# Patient Record
Sex: Female | Born: 2000 | Hispanic: No | Marital: Single
Health system: Southern US, Community
[De-identification: ages and names within clinical notes are randomized; demographics above are authoritative.]

---

## 2021-08-18 ENCOUNTER — Other Ambulatory Visit: Payer: Self-pay

## 2021-08-18 ENCOUNTER — Emergency Department (HOSPITAL_BASED_OUTPATIENT_CLINIC_OR_DEPARTMENT_OTHER)
Admission: EM | Admit: 2021-08-18 | Discharge: 2021-08-18 | Disposition: A | Discharge status: Home / Self Care | Payer: 59 | Attending: Emergency Medicine | Admitting: Emergency Medicine

## 2021-08-18 ENCOUNTER — Emergency Department (HOSPITAL_BASED_OUTPATIENT_CLINIC_OR_DEPARTMENT_OTHER): Payer: 59

## 2021-08-18 ENCOUNTER — Encounter (HOSPITAL_BASED_OUTPATIENT_CLINIC_OR_DEPARTMENT_OTHER): Payer: Self-pay | Admitting: *Deleted

## 2021-08-18 DIAGNOSIS — R1031 Right lower quadrant pain: Secondary | ICD-10-CM | POA: Diagnosis not present

## 2021-08-18 LAB — COMPREHENSIVE METABOLIC PANEL
ALT: 10 U/L (ref 0–44)
AST: 15 U/L (ref 15–41)
Albumin: 3.8 g/dL (ref 3.5–5.0)
Alkaline Phosphatase: 64 U/L (ref 38–126)
Anion gap: 11 (ref 5–15)
BUN: 14 mg/dL (ref 6–20)
CO2: 26 mmol/L (ref 22–32)
Calcium: 8.9 mg/dL (ref 8.9–10.3)
Chloride: 99 mmol/L (ref 98–111)
Creatinine, Ser: 0.66 mg/dL (ref 0.44–1.00)
GFR, Estimated: >60 mL/min (ref >60)
Glucose, Bld: 82 mg/dL (ref 70–99)
Potassium: 3.5 mmol/L (ref 3.5–5.1)
Sodium: 136 mmol/L (ref 135–145)
Total Bilirubin: 0.2 mg/dL — ABNORMAL LOW (ref 0.3–1.2)
Total Protein: 6.9 g/dL (ref 6.5–8.1)

## 2021-08-18 LAB — URINALYSIS, ROUTINE W REFLEX MICROSCOPIC
Glucose, UA: NEGATIVE mg/dL
Hgb urine dipstick: NEGATIVE
Ketones, ur: 40 mg/dL — AB
Leukocytes,Ua: NEGATIVE
Nitrite: NEGATIVE
Protein, ur: NEGATIVE mg/dL
Specific Gravity, Urine: >1.03 — ABNORMAL HIGH (ref 1.005–1.030)
pH: 6 (ref 5.0–8.0)

## 2021-08-18 LAB — CBC
HCT: 40.8 % (ref 36.0–46.0)
Hemoglobin: 13.4 g/dL (ref 12.0–15.0)
MCH: 28.2 pg (ref 26.0–34.0)
MCHC: 32.8 g/dL (ref 30.0–36.0)
MCV: 85.7 fL (ref 80.0–100.0)
Platelets: 330 10*3/uL (ref 150–400)
RBC: 4.76 MIL/uL (ref 3.87–5.11)
RDW: 13.2 % (ref 11.5–15.5)
WBC: 11.3 10*3/uL — ABNORMAL HIGH (ref 4.0–10.5)
nRBC: 0 % (ref 0.0–0.2)

## 2021-08-18 LAB — PREGNANCY, URINE: Preg Test, Ur: NEGATIVE

## 2021-08-18 LAB — LIPASE, BLOOD: Lipase: 30 U/L (ref 11–51)

## 2021-08-18 MED ORDER — IOHEXOL 350 MG/ML SOLN
85.0000 mL | Freq: Once | INTRAVENOUS | Status: AC | PRN
  Administered 2021-08-18: 85 mL via INTRAVENOUS

## 2021-08-18 NOTE — Discharge Instructions (Addendum)
Patricia Sparks was seen in the ER today for right lower belly pain.  Her physical exam, vital signs, blood work, and CT scan were reassuring.  It appears she likely had a small ovarian cyst that ruptured.  This can be a normal physiologic process and is nothing to be concerned about.  May use Tylenol or ibuprofen as needed for your pain.  Please follow-up with your pediatrician in the outpatient setting.  Return to the ER with any worsening abdominal pain, nausea or vomiting that does not stop, chest pain, difficulty breathing, or any other severe symptom.

## 2021-08-18 NOTE — ED Triage Notes (Signed)
Sent from UC-r/o appy.  Reports RLQ pain x 1 day.  Denies N/V.  Denies fever.

## 2021-08-18 NOTE — ED Provider Notes (Signed)
[facility] EMERGENCY DEPARTMENT Provider Note   CSN: [id number] Arrival date & time: 08/18/21  1634     History Chief Complaint  Patient presents with   Abdominal Pain    Patricia Sparks is a 20 y.o. female who presents with concern for right lower abdominal pain that started around 3:00 this afternoon.  Presents with her father at the bedside.  No associated fever, chills, nausea, vomiting, or diarrhea.  No dysuria, urinary urgency.  LMP 08/03/2021.  Patient denies sexual activity.  Denies any vaginal bleeding or discharge.  Patient was seen at urgent care this afternoon and directed to the emergency department for concern for possible appendicitis.  I personally reviewed this patient's medical records.  She is an otherwise healthy 20 year old female who does not carry any medical diagnoses and is not on any medications every day.  HPI     No past medical history on file.  There are no problems to display for this patient.      OB History   No obstetric history on file.     No family history on file.  Social History   Substance Use Topics   Alcohol use: Never   Drug use: Never    Home Medications Prior to Admission medications   Not on File    Allergies    Patient has no known allergies.  Review of Systems   Review of Systems  Constitutional: Negative.   HENT: Negative.    Respiratory: Negative.    Cardiovascular: Negative.   Gastrointestinal:  Positive for abdominal pain. Negative for constipation, diarrhea, nausea and vomiting.  Genitourinary: Negative.   Musculoskeletal: Negative.   Skin: Negative.   Neurological: Negative.    Physical Exam Updated Vital Signs BP 109/62   Pulse 61   Temp 98.9 F (37.2 C) (Oral)   Resp 18   Ht 4\' 8"  (1.422 m)   Wt 86.2 kg   LMP 07/03/2021 Comment: Negative pregnancy test on 08/18/21  SpO2 100%   BMI 42.60 kg/m   Physical Exam Vitals and nursing note reviewed.  Constitutional:      Appearance:  She is not ill-appearing or toxic-appearing.  HENT:     Head: Normocephalic and atraumatic.     Nose: Nose normal.     Mouth/Throat:     Mouth: Mucous membranes are moist.     Pharynx: Oropharynx is clear. Uvula midline. No oropharyngeal exudate or posterior oropharyngeal erythema.     Tonsils: No tonsillar exudate.  Eyes:     General: Lids are normal. Vision grossly intact.        Right eye: No discharge.        Left eye: No discharge.     Conjunctiva/sclera: Conjunctivae normal.  Neck:     Trachea: Trachea normal.  Cardiovascular:     Rate and Rhythm: Normal rate and regular rhythm.     Pulses: Normal pulses.     Heart sounds: Normal heart sounds. No murmur heard. Pulmonary:     Effort: Pulmonary effort is normal. No tachypnea, bradypnea, accessory muscle usage, prolonged expiration or respiratory distress.     Breath sounds: Normal breath sounds. No wheezing or rales.  Chest:     Chest wall: No mass, lacerations, deformity, swelling, tenderness, crepitus or edema.  Abdominal:     General: Bowel sounds are normal. There is no distension.     Palpations: Abdomen is soft.     Tenderness: There is abdominal tenderness in the right lower quadrant. There  is no right CVA tenderness, left CVA tenderness, guarding or rebound. Positive signs include McBurney's sign and obturator sign. Negative signs include Murphy's sign and Rovsing's sign.  Musculoskeletal:        General: No deformity.     Cervical back: Normal range of motion and neck supple. No edema, rigidity or crepitus. No pain with movement, spinous process tenderness or muscular tenderness.     Right lower leg: No edema.     Left lower leg: No edema.  Lymphadenopathy:     Cervical: No cervical adenopathy.  Skin:    General: Skin is warm and dry.     Findings: No rash.  Neurological:     Mental Status: She is alert. Mental status is at baseline.     Gait: Gait is intact.  Psychiatric:        Mood and Affect: Mood normal.     ED Results / Procedures / Treatments   Labs (all labs ordered are listed, but only abnormal results are displayed) Labs Reviewed  COMPREHENSIVE METABOLIC PANEL - Abnormal; Notable for the following components:      Result Value   Total Bilirubin 0.2 (*)    All other components within normal limits  CBC - Abnormal; Notable for the following components:   WBC 11.3 (*)    All other components within normal limits  URINALYSIS, ROUTINE W REFLEX MICROSCOPIC - Abnormal; Notable for the following components:   Specific Gravity, Urine >1.030 (*)    Bilirubin Urine SMALL (*)    Ketones, ur 40 (*)    All other components within normal limits  LIPASE, BLOOD  PREGNANCY, URINE    EKG None  Radiology CT Abdomen Pelvis W Contrast  Result Date: 08/18/2021 CLINICAL DATA:  Right lower quadrant abdominal pain x1 day EXAM: CT ABDOMEN AND PELVIS WITH CONTRAST TECHNIQUE: Multidetector CT imaging of the abdomen and pelvis was performed using the standard protocol following bolus administration of intravenous contrast. CONTRAST:  70mL OMNIPAQUE IOHEXOL 350 MG/ML SOLN COMPARISON:  None. FINDINGS: Lower chest: Lung bases are clear. Hepatobiliary: Liver is within normal limits. Gallbladder is unremarkable. No intrahepatic or extrahepatic ductal dilatation. Pancreas: Within normal limits. Spleen: Within normal limits. Adrenals/Urinary Tract: Adrenal glands are within normal limits. Kidneys are within normal limits.  No hydronephrosis. Bladder is within normal limits. Stomach/Bowel: Stomach is within normal limits. No evidence of bowel obstruction. Normal appendix (series 2/image 66). No colonic wall thickening or inflammatory changes. Vascular/Lymphatic: No evidence of abdominal aortic aneurysm. No suspicious abdominopelvic lymphadenopathy. Reproductive: Retroverted uterus. Endometrium measures 17 mm, within the upper limits of normal. Bilateral ovaries are within normal limits. Other: Small volume pelvic  ascites, likely physiologic. Musculoskeletal: Visualized osseous structures are within normal limits. IMPRESSION: No evidence of bowel obstruction.  Normal appendix. Small volume pelvic ascites, likely physiologic. Electronically Signed   By: Charline Bills M.D.   On: 08/18/2021 22:44    Procedures Procedures   Medications Ordered in ED Medications  iohexol (OMNIPAQUE) 350 MG/ML injection 85 mL (85 mLs Intravenous Contrast Given 08/18/21 2231)    ED Course  I have reviewed the triage vital signs and the nursing notes.  Pertinent labs & imaging results that were available during my care of the patient were reviewed by me and considered in my medical decision making (see chart for details).    MDM Rules/Calculators/A&P                         20 year old  female presents with concern for 1 afternoon after right lower abdominal pain, directed to the ED by urgent care for concern for possible appendicitis.  Differential diagnosis includes not limited to appendicitis, diverticulitis, cholecystitis, cholangitis, choledocholithiasis, biliary colic, colitis, gastroenteritis, ovarian torsion or cyst, mittelschmerz.  Vital signs are normal on intake.  Cardiopulmonary dam is normal abdominal exam is significant for positive McBurney point tenderness and positive obturator sign, no Rovsing sign, rebound, or guarding.  Patient is neurovascular intact in all 4 extremities.  CBC with mild leukocytosis of 11.3, CMP unremarkable, UA unremarkable.  Patient is not pregnant.  CT of the abdomen pelvis negative for appendicitis or any other acute intra-abdominal pelvic abnormality.  There is a small volume pelvic ascites likely physiologic.  Discussed findings of CT with patient and her father at the bedside, likely secondary to small ruptured ovarian cyst on the right side.  Patient states pain is improving gradually as time goes on, she remains well-appearing with normal vital signs.  No further work-up  warranted needed this time.  Recommend Tylenol improvement as needed.  Recommend outpatient follow-up with primary care doctor and strict turn precautions given.  Felisha voiced understanding of her medical evaluation and treatment plan.  Each of her questions was answered to her expressed satisfaction.  Return precautions are given.  Patient is well-appearing, stable, and appropriate for discharge at this time.  This chart was dictated using voice recognition software, Dragon. Despite the best efforts of this provider to proofread and correct errors, errors may still occur which can change documentation meaning.  Final Clinical Impression(s) / ED Diagnoses Final diagnoses:  Right lower quadrant abdominal pain    Rx / DC Orders ED Discharge Orders     None        Sherrilee Gilles 08/19/21 [id number]    Tegeler, Canary Brim, MD 08/19/21 1055

## 2022-08-22 IMAGING — CT CT ABD-PELV W/ CM
2 of 4 series · 17 of 46 positions shown, 19 images · IV contrast (omnipaque)
Comparison: None.

CLINICAL DATA: Right lower quadrant abdominal pain x1 day

EXAM:
CT ABDOMEN AND PELVIS WITH CONTRAST
TECHNIQUE: Multidetector CT imaging of the abdomen and pelvis was performed
using the standard protocol following bolus administration of
intravenous contrast.
CONTRAST:  85mL OMNIPAQUE IOHEXOL 350 MG/ML SOLN

[Series 2: axial st · axial · 0.95mm/px · z∈[-852,-462]mm · 14 of 86 slices shown, 16 images]
[im 4/86  soft-tissue]
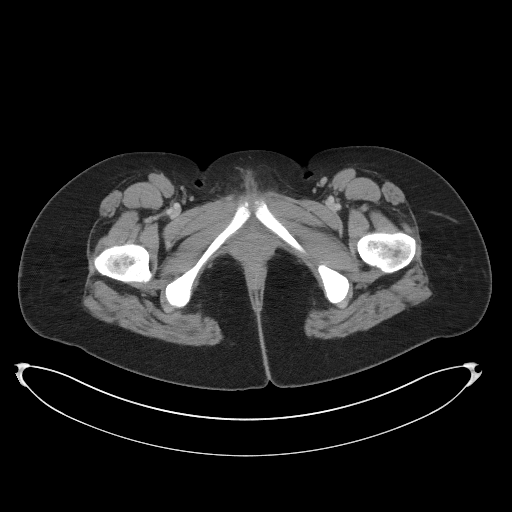
[im 4/86  bone]
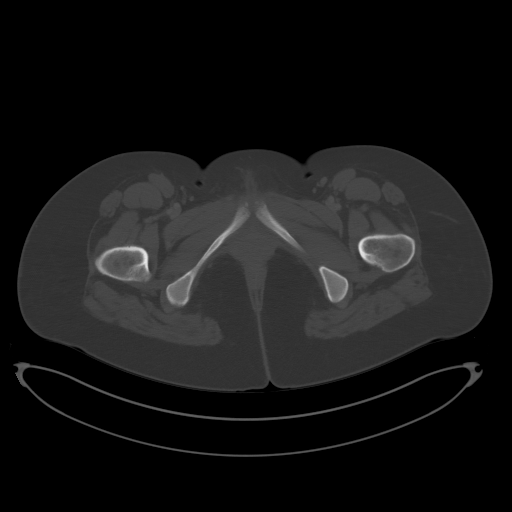
[im 10/86  soft-tissue]
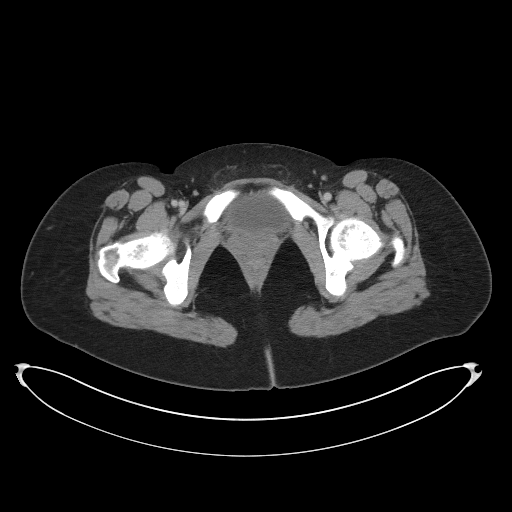
[im 17/86  soft-tissue]
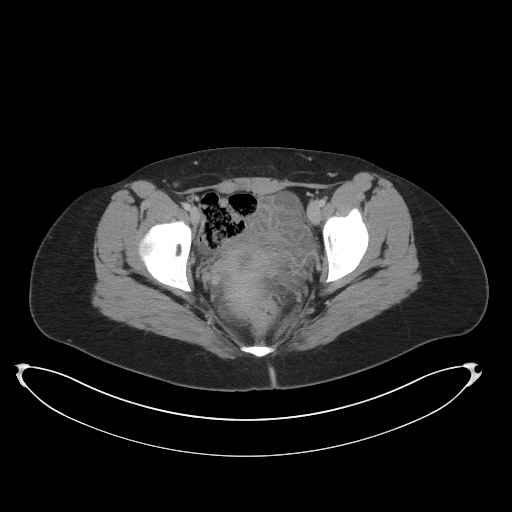
[im 23/86  soft-tissue]
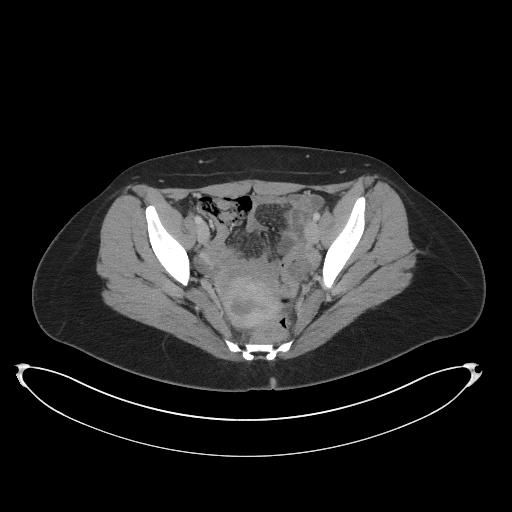
[im 30/86  soft-tissue]
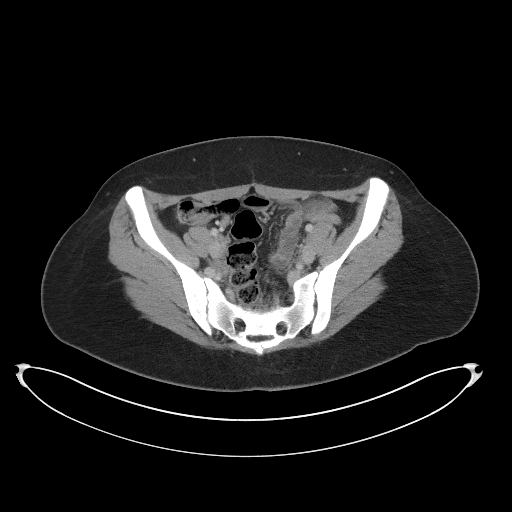
[im 33/86  soft-tissue]
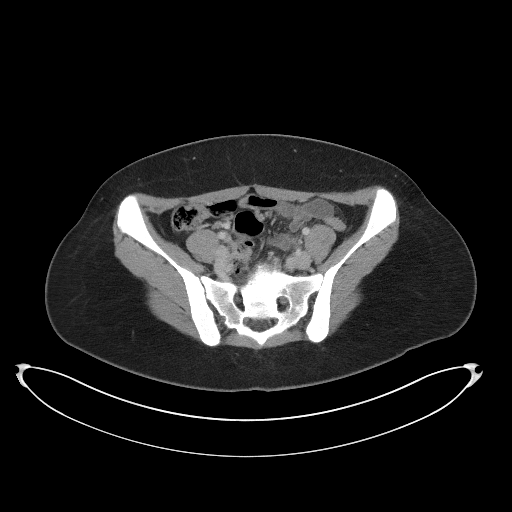
[im 40/86  soft-tissue]
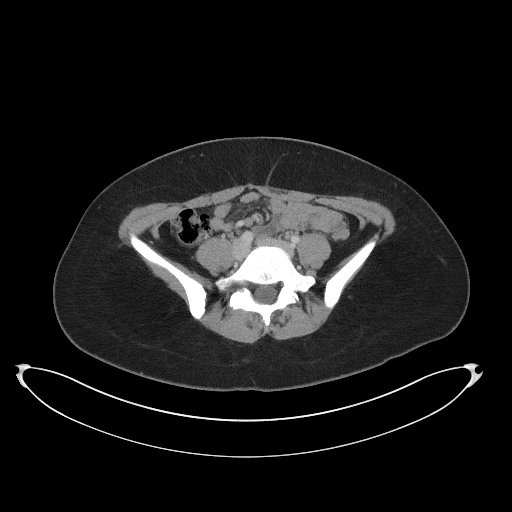
[im 46/86  soft-tissue]
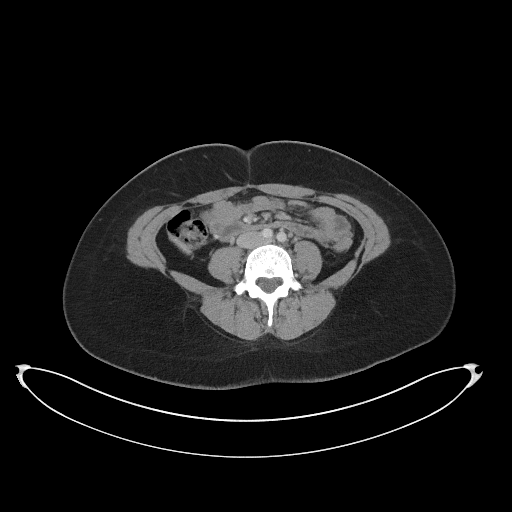
[im 53/86  soft-tissue]
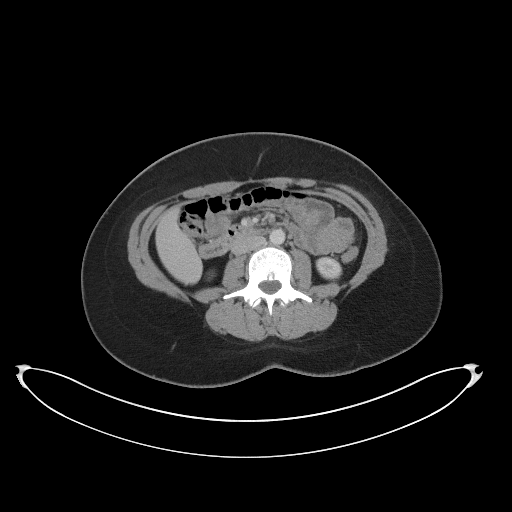
[im 53/86  bone]
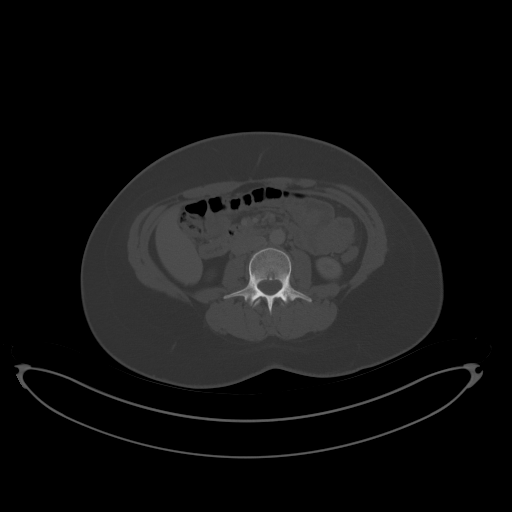
[im 56/86  soft-tissue]
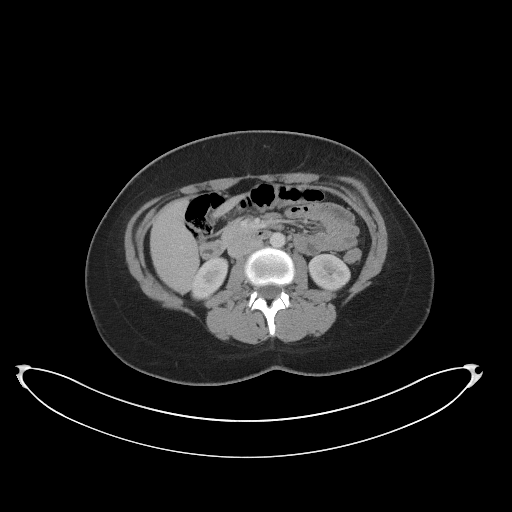
[im 63/86  soft-tissue]
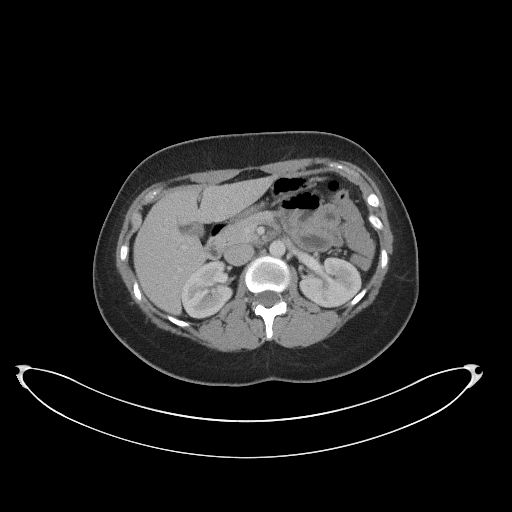
[im 69/86  soft-tissue]
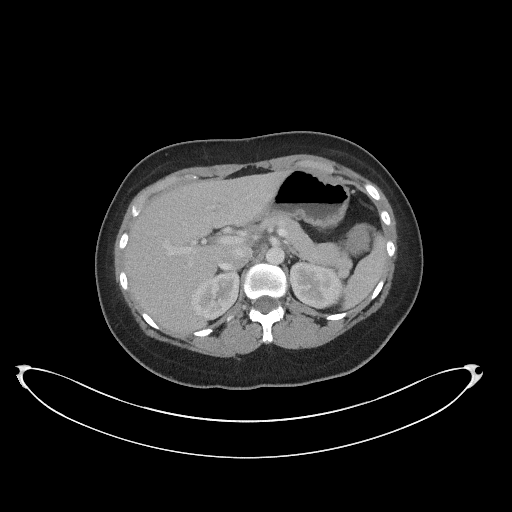
[im 76/86  soft-tissue]
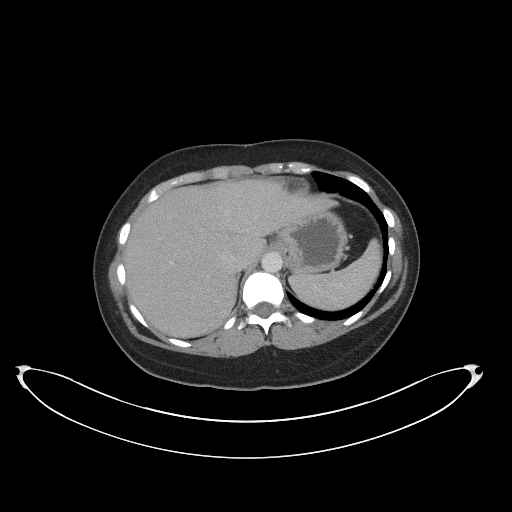
[im 82/86  soft-tissue]
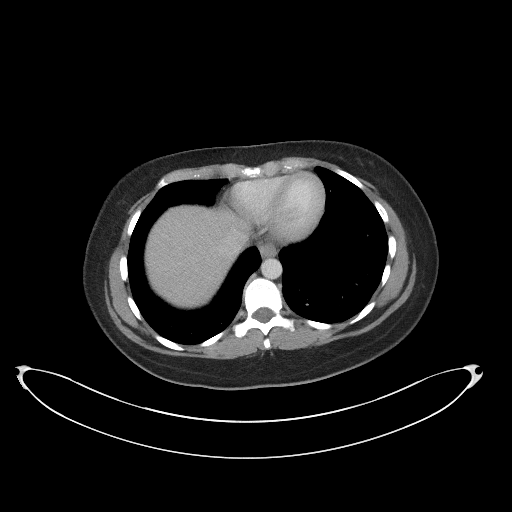

[Series 5: coronal st · coronal · 0.86mm/px · 3 of 90 slices shown]
[im 30/90  soft-tissue]
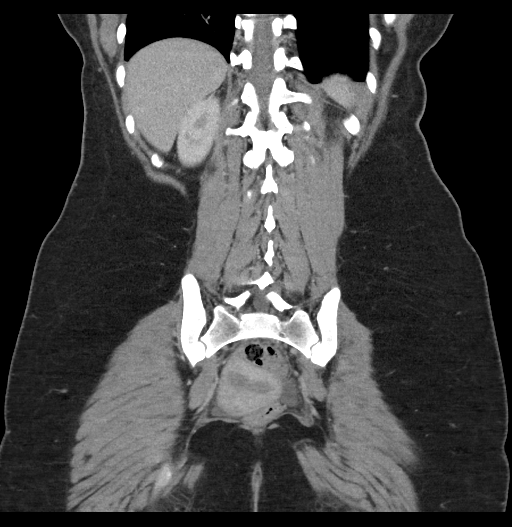
[im 40/90  soft-tissue]
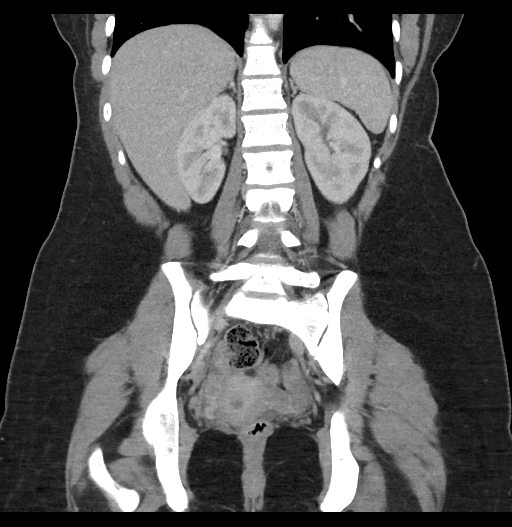
[im 50/90  soft-tissue]
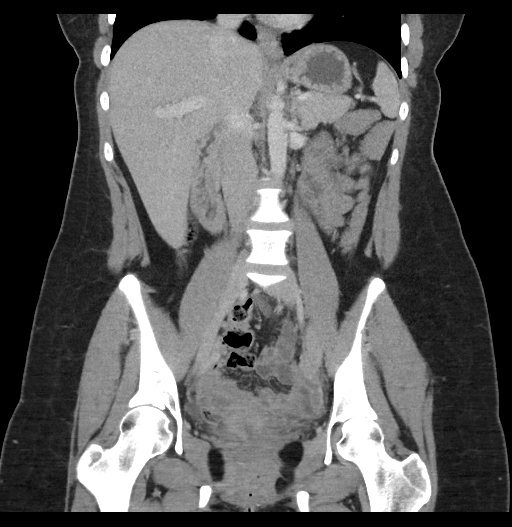

[17 of 46 positions shown; findings below may reference images not displayed]

FINDINGS: Lower chest: Lung bases are clear.

Hepatobiliary: Liver is within normal limits.

Gallbladder is unremarkable. No intrahepatic or extrahepatic ductal
dilatation.

Pancreas: Within normal limits.

Spleen: Within normal limits.

Adrenals/Urinary Tract: Adrenal glands are within normal limits.

Kidneys are within normal limits.  No hydronephrosis.

Bladder is within normal limits.

Stomach/Bowel: Stomach is within normal limits.

No evidence of bowel obstruction.

Normal appendix (series 2/image 66).

No colonic wall thickening or inflammatory changes.

Vascular/Lymphatic: No evidence of abdominal aortic aneurysm.

No suspicious abdominopelvic lymphadenopathy.

Reproductive: Retroverted uterus. Endometrium measures 17 mm, within
the upper limits of normal.

Bilateral ovaries are within normal limits.

Other: Small volume pelvic ascites, likely physiologic.

Musculoskeletal: Visualized osseous structures are within normal
limits.
IMPRESSION: No evidence of bowel obstruction.  Normal appendix.

Small volume pelvic ascites, likely physiologic.
